# Patient Record
Sex: Male | Born: 1965 | Race: Black or African American | Hispanic: No | Marital: Single
Health system: Southern US, Community
[De-identification: ages and names within clinical notes are randomized; demographics above are authoritative.]

## PROBLEM LIST (undated history)

## (undated) HISTORY — PX: TONSILLECTOMY: SUR1361

---

## 2004-08-07 ENCOUNTER — Emergency Department: Payer: Self-pay | Admitting: Emergency Medicine

## 2004-08-11 ENCOUNTER — Emergency Department: Payer: Self-pay | Admitting: Emergency Medicine

## 2005-04-15 ENCOUNTER — Emergency Department (HOSPITAL_COMMUNITY): Admission: EM | Admit: 2005-04-15 | Discharge: 2005-04-15 | Payer: Self-pay | Admitting: Emergency Medicine

## 2020-08-05 ENCOUNTER — Other Ambulatory Visit: Payer: Self-pay

## 2020-08-05 ENCOUNTER — Emergency Department (HOSPITAL_COMMUNITY)
Admission: EM | Admit: 2020-08-05 | Discharge: 2020-08-06 | Disposition: A | Discharge status: Home / Self Care | Payer: No Typology Code available for payment source | Attending: Emergency Medicine | Admitting: Emergency Medicine

## 2020-08-05 DIAGNOSIS — Y92410 Unspecified street and highway as the place of occurrence of the external cause: Secondary | ICD-10-CM | POA: Insufficient documentation

## 2020-08-05 DIAGNOSIS — R109 Unspecified abdominal pain: Secondary | ICD-10-CM | POA: Diagnosis not present

## 2020-08-05 DIAGNOSIS — S39012A Strain of muscle, fascia and tendon of lower back, initial encounter: Secondary | ICD-10-CM | POA: Diagnosis not present

## 2020-08-05 DIAGNOSIS — S3992XA Unspecified injury of lower back, initial encounter: Secondary | ICD-10-CM | POA: Diagnosis present

## 2020-08-05 DIAGNOSIS — S8392XA Sprain of unspecified site of left knee, initial encounter: Secondary | ICD-10-CM | POA: Insufficient documentation

## 2020-08-05 DIAGNOSIS — S8391XA Sprain of unspecified site of right knee, initial encounter: Secondary | ICD-10-CM | POA: Diagnosis not present

## 2020-08-05 DIAGNOSIS — R519 Headache, unspecified: Secondary | ICD-10-CM | POA: Insufficient documentation
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
## 2020-08-05 NOTE — ED Triage Notes (Signed)
C/o pain in the right flank area, bilateral knee pain, and headache

## 2020-08-06 ENCOUNTER — Encounter (HOSPITAL_COMMUNITY): Payer: Self-pay | Admitting: *Deleted

## 2020-08-06 ENCOUNTER — Emergency Department (HOSPITAL_COMMUNITY): Payer: No Typology Code available for payment source

## 2020-08-06 MED ORDER — IBUPROFEN 400 MG PO TABS
600.0000 mg | ORAL_TABLET | Freq: Once | ORAL | Status: AC
  Administered 2020-08-06: 600 mg via ORAL
  Filled 2020-08-06: qty 1

## 2020-08-06 MED ORDER — KETOROLAC TROMETHAMINE 60 MG/2ML IM SOLN
60.0000 mg | Freq: Once | INTRAMUSCULAR | Status: AC
  Administered 2020-08-06: 60 mg via INTRAMUSCULAR
  Filled 2020-08-06: qty 2
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
## 2020-08-06 NOTE — ED Notes (Signed)
Pt verbalized understanding of d/c instructions, meds and followup care. Denies questions. VSS, no distress noted. Steady gait to exit.  

## 2022-05-03 DIAGNOSIS — F1020 Alcohol dependence, uncomplicated: Secondary | ICD-10-CM | POA: Diagnosis not present

## 2022-05-04 DIAGNOSIS — F1020 Alcohol dependence, uncomplicated: Secondary | ICD-10-CM | POA: Diagnosis not present

## 2022-05-05 DIAGNOSIS — F1020 Alcohol dependence, uncomplicated: Secondary | ICD-10-CM | POA: Diagnosis not present

## 2022-05-14 DIAGNOSIS — F1010 Alcohol abuse, uncomplicated: Secondary | ICD-10-CM | POA: Diagnosis not present

## 2022-05-14 DIAGNOSIS — F1410 Cocaine abuse, uncomplicated: Secondary | ICD-10-CM | POA: Diagnosis not present

## 2022-05-14 DIAGNOSIS — F17200 Nicotine dependence, unspecified, uncomplicated: Secondary | ICD-10-CM | POA: Diagnosis not present

## 2022-05-14 DIAGNOSIS — Z79899 Other long term (current) drug therapy: Secondary | ICD-10-CM | POA: Diagnosis not present

## 2022-05-15 DIAGNOSIS — F1020 Alcohol dependence, uncomplicated: Secondary | ICD-10-CM | POA: Diagnosis not present

## 2022-05-17 DIAGNOSIS — F1020 Alcohol dependence, uncomplicated: Secondary | ICD-10-CM | POA: Diagnosis not present

## 2022-05-18 DIAGNOSIS — F1020 Alcohol dependence, uncomplicated: Secondary | ICD-10-CM | POA: Diagnosis not present

## 2022-05-19 DIAGNOSIS — F1020 Alcohol dependence, uncomplicated: Secondary | ICD-10-CM | POA: Diagnosis not present

## 2022-05-20 DIAGNOSIS — F1020 Alcohol dependence, uncomplicated: Secondary | ICD-10-CM | POA: Diagnosis not present
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
## 2022-11-19 DIAGNOSIS — F1021 Alcohol dependence, in remission: Secondary | ICD-10-CM | POA: Diagnosis not present

## 2022-11-19 DIAGNOSIS — F17210 Nicotine dependence, cigarettes, uncomplicated: Secondary | ICD-10-CM | POA: Diagnosis not present

## 2022-11-19 DIAGNOSIS — F1421 Cocaine dependence, in remission: Secondary | ICD-10-CM | POA: Diagnosis not present

## 2022-11-19 DIAGNOSIS — Z833 Family history of diabetes mellitus: Secondary | ICD-10-CM | POA: Diagnosis not present

## 2022-11-19 DIAGNOSIS — Z807 Family history of other malignant neoplasms of lymphoid, hematopoietic and related tissues: Secondary | ICD-10-CM | POA: Diagnosis not present

## 2022-11-19 DIAGNOSIS — Z818 Family history of other mental and behavioral disorders: Secondary | ICD-10-CM | POA: Diagnosis not present
